# Patient Record
Sex: Male | Born: 1980 | Race: Black or African American | Hispanic: No | Marital: Married | Smoking: Current every day smoker
Health system: Southern US, Community
[De-identification: ages and names within clinical notes are randomized; demographics above are authoritative.]

## PROBLEM LIST (undated history)

## (undated) DIAGNOSIS — B029 Zoster without complications: Secondary | ICD-10-CM

## (undated) HISTORY — PX: TYMPANOSTOMY TUBE PLACEMENT: SHX32

## (undated) HISTORY — PX: NASAL SINUS SURGERY: SHX719

---

## 2015-02-21 ENCOUNTER — Emergency Department (HOSPITAL_COMMUNITY): Payer: Self-pay

## 2015-02-21 ENCOUNTER — Encounter (HOSPITAL_COMMUNITY): Payer: Self-pay | Admitting: Emergency Medicine

## 2015-02-21 ENCOUNTER — Emergency Department (HOSPITAL_COMMUNITY)
Admission: EM | Admit: 2015-02-21 | Discharge: 2015-02-21 | Disposition: A | Discharge status: Home / Self Care | Payer: Self-pay | Attending: Emergency Medicine | Admitting: Emergency Medicine

## 2015-02-21 DIAGNOSIS — Y9289 Other specified places as the place of occurrence of the external cause: Secondary | ICD-10-CM | POA: Insufficient documentation

## 2015-02-21 DIAGNOSIS — Y998 Other external cause status: Secondary | ICD-10-CM | POA: Insufficient documentation

## 2015-02-21 DIAGNOSIS — S82102A Unspecified fracture of upper end of left tibia, initial encounter for closed fracture: Secondary | ICD-10-CM | POA: Insufficient documentation

## 2015-02-21 DIAGNOSIS — Y9389 Activity, other specified: Secondary | ICD-10-CM | POA: Insufficient documentation

## 2015-02-21 DIAGNOSIS — Z720 Tobacco use: Secondary | ICD-10-CM | POA: Insufficient documentation

## 2015-02-21 DIAGNOSIS — S82142A Displaced bicondylar fracture of left tibia, initial encounter for closed fracture: Secondary | ICD-10-CM

## 2015-02-21 DIAGNOSIS — X58XXXA Exposure to other specified factors, initial encounter: Secondary | ICD-10-CM | POA: Insufficient documentation

## 2015-02-21 MED ORDER — HYDROCODONE-ACETAMINOPHEN 5-325 MG PO TABS: 2.0000 | ORAL_TABLET | ORAL | Status: DC | PRN

## 2015-02-21 NOTE — ED Notes (Signed)
Pt states that he was standing playing with his son when he had a sudden sharp pain in his lt knee - believes that he dislocated his knee cap at that time. Then on Monday it slipped back into place- Pain ever since

## 2015-02-21 NOTE — ED Provider Notes (Signed)
CSN: [id number]     Arrival date & time 02/21/15  1057 History   First MD Initiated Contact with Patient 02/21/15 1149     Chief Complaint  Patient presents with  . Knee Injury     (Consider location/radiation/quality/duration/timing/severity/associated sxs/prior Treatment) Patient is a 34 y.o. male presenting with knee pain. The history is provided by the patient. No language interpreter was used.  Knee Pain Location:  Knee Time since incident:  1 day Knee location:  L knee Pain details:    Quality:  Aching   Radiates to:  Does not radiate   Severity:  Moderate   Onset quality:  Gradual   Duration:  1 day   Timing:  Constant   Progression:  Worsening Chronicity:  New Foreign body present:  No foreign bodies Tetanus status:  Up to date Relieved by:  Nothing Worsened by:  Nothing tried Ineffective treatments:  None tried Associated symptoms: decreased ROM   Risk factors: no recent illness     History reviewed. No pertinent past medical history. Past Surgical History  Procedure Laterality Date  . Nasal sinus surgery    . Tympanostomy tube placement     History reviewed. No pertinent family history. History  Substance Use Topics  . Smoking status: Current Every Day Smoker -- 0.50 packs/day for 10 years    Types: Cigarettes  . Smokeless tobacco: Never Used  . Alcohol Use: Yes     Comment: occ    Review of Systems  All other systems reviewed and are negative.     Allergies  Review of patient's allergies indicates no known allergies.  Home Medications   Prior to Admission medications   Medication Sig Start Date End Date Taking? Authorizing Provider  acetaminophen (TYLENOL) 500 MG tablet Take 1,000 mg by mouth every 6 (six) hours as needed for mild pain.   Yes Historical Provider, MD   BP 147/79 mmHg  Pulse 67  Temp(Src) 98.9 F (37.2 C) (Oral)  Resp 18  Ht 6' (1.829 m)  Wt 145 lb (65.772 kg)  BMI 19.66 kg/m2  SpO2 99% Physical Exam  Constitutional:  He is oriented to person, place, and time. He appears well-developed and well-nourished.  HENT:  Head: Normocephalic.  Eyes: EOM are normal.  Neck: Normal range of motion.  Pulmonary/Chest: Effort normal.  Abdominal: He exhibits no distension.  Musculoskeletal: He exhibits tenderness.  Swollen tender left knee, effusion  Limited range of motion  nv and ns intact  Neurological: He is alert and oriented to person, place, and time.  Psychiatric: He has a normal mood and affect.  Nursing note and vitals reviewed.   ED Course  Procedures (including critical care time) Labs Review Labs Reviewed - No data to display  Imaging Review Dg Knee Complete 4 Views Left  02/21/2015   CLINICAL DATA:  Sudden sharp left knee pain while standing and playing with his son 3 days ago.  EXAM: LEFT KNEE - COMPLETE 4+ VIEW  COMPARISON:  None.  FINDINGS: There is a fracture in the medial tibial plateau anteriorly, involving the joint space. No significant displacement. Minimal effusion. Diffuse soft tissue swelling.  IMPRESSION: Medial tibial plateau fracture with an associated minimal effusion.   Electronically Signed   By: Beckie Salts M.D.   On: 02/21/2015 11:59     EKG Interpretation None      MDM   Final diagnoses:  Tibial plateau fracture, left, closed, initial encounter    I spoke to Dr. Romeo Apple who  advised he will see in office Knee imbolizer Crutches Hydrocdone avs    Elson AreasLeslie K Sofia, PA-C 02/22/15 [id number]  Bethann BerkshireJoseph Zammit, MD 02/23/15 [phone]

## 2015-02-21 NOTE — Discharge Instructions (Signed)
Tibial and Fibular Fracture, Adult °Tibial and fibular fracture is a break in the bones of your lower leg (tibia and fibula). The tibia is the larger of these two bones. The fibula is the smaller of the two bones. It is on the outer side of your leg.  °CAUSES °· Low-energy injuries, such as a fall from ground level. °· High-energy injuries, such as motor vehicle injuries, gunshot wounds, or high-speed sports collisions. °RISK FACTORS °· Jumping activities. °· Repetitive stress, such as long-distance running. °· Participation in sports. °· Osteoporosis. °· Advanced age. °SIGNS AND SYMPTOMS °· Pain. °· Swelling. °· Inability to put weight on your injured leg. °· Bone deformities at the site of your injury. °· Bruising. °DIAGNOSIS  °Tibial and fibular fractures are diagnosed with the use of X-ray exams. °TREATMENT  °If you have a simple fracture of these two bones, they can be treated with simple immobilization. A cast or splint will be used on your leg to keep it from moving while it heals. Then you can begin range-of-motion exercises to regain your knee motion. °HOME CARE INSTRUCTIONS  °· Apply ice to your leg: °¨ Put ice in a plastic bag. °¨ Place a towel between your skin and the bag. °¨ Leave the ice on for 20 minutes, 2-3 times a day. °· If you have a plaster or fiberglass cast: °¨ Do not try to scratch the skin under the cast using sharp or pointed objects. °¨ Check the skin around the cast every day. You may put lotion on any red or sore areas. °¨ Keep your cast dry and clean. °· If you have a plaster splint: °¨ Wear the splint as directed. °¨ You may loosen the elastic around the splint if your toes become numb, tingle, or turn cold or blue. °· Do not put pressure on any part of your cast or splint until it is fully hardened, because it may deform. °· Your cast or splint can be protected during bathing with a plastic bag. Do not lower the cast or splint into water. °· Use crutches as directed. °· Only take  over-the-counter or prescription medicines for pain, discomfort, or fever as directed by your health care provider. °· Follow all instructions given to you by your health care provider. °· Make and keep all follow-up appointments. °SEEK MEDICAL CARE IF: °· Your pain is becoming worse rather than better or is not controlled with medicines. °· You have increased swelling or redness in the foot. °· You begin to lose feeling in your foot or toes. °SEEK IMMEDIATE MEDICAL CARE IF: °· You develop a cold or blue foot or toes on the injured side. °· You develop severe pain in your injured leg, especially if the pain is increased with movement of your toes. °MAKE SURE YOU: °· Understand these instructions. °· Will watch your condition. °· Will get help right away if you are not doing well or get worse. °Document Released: 04/25/2002 Document Revised: 05/24/2013 Document Reviewed: 03/15/2013 °[employer]® Patient Information ©2015 [employer]. This information is not intended to replace advice given to you by your health care provider. Make sure you discuss any questions you have with your health care provider. ° °

## 2015-02-25 ENCOUNTER — Ambulatory Visit (INDEPENDENT_AMBULATORY_CARE_PROVIDER_SITE_OTHER): Payer: Self-pay | Admitting: Orthopedic Surgery

## 2015-02-25 ENCOUNTER — Encounter: Payer: Self-pay | Admitting: Orthopedic Surgery

## 2015-02-25 VITALS — BP 132/85 | Ht 72.0 in | Wt 145.0 lb

## 2015-02-25 DIAGNOSIS — S82112A Displaced fracture of left tibial spine, initial encounter for closed fracture: Secondary | ICD-10-CM

## 2015-02-25 MED ORDER — IBUPROFEN 800 MG PO TABS: 800.0000 mg | ORAL_TABLET | Freq: Three times a day (TID) | ORAL | Status: DC | PRN

## 2015-02-25 MED ORDER — HYDROCODONE-ACETAMINOPHEN 10-325 MG PO TABS: 1.0000 | ORAL_TABLET | ORAL | Status: DC | PRN
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
## 2015-02-25 NOTE — Patient Instructions (Signed)
Out of work 6 weeks  Keep leg straight  Call when you are ready to schedule MRI

## 2015-03-11 ENCOUNTER — Telehealth: Payer: Self-pay | Admitting: Orthopedic Surgery

## 2015-03-11 NOTE — Telephone Encounter (Signed)
Patient called to relay that he would like to proceed with MRI as discussed at appointment 02/25/15; states is prepared with self-pay amount.  Patient has appointment scheduled here 03/21/15.  Please advise regarding ordering the MRI.  Patient's ph# is [phone].

## 2015-03-12 ENCOUNTER — Other Ambulatory Visit: Payer: Self-pay | Admitting: *Deleted

## 2015-03-12 DIAGNOSIS — S82112A Displaced fracture of left tibial spine, initial encounter for closed fracture: Secondary | ICD-10-CM

## 2015-03-12 NOTE — Telephone Encounter (Signed)
NOTED, MRI ORDERED, PATIENT WILL BE CALLED ONCE MRI IS SCHEDULED

## 2015-03-21 ENCOUNTER — Ambulatory Visit: Payer: Self-pay | Admitting: Orthopedic Surgery

## 2015-03-27 ENCOUNTER — Ambulatory Visit (HOSPITAL_COMMUNITY)
Admission: RE | Admit: 2015-03-27 | Discharge: 2015-03-27 | Disposition: A | Discharge status: Home / Self Care | Payer: Self-pay | Source: Ambulatory Visit | Attending: Orthopedic Surgery | Admitting: Orthopedic Surgery

## 2015-03-27 DIAGNOSIS — Y939 Activity, unspecified: Secondary | ICD-10-CM | POA: Insufficient documentation

## 2015-03-27 DIAGNOSIS — S82142A Displaced bicondylar fracture of left tibia, initial encounter for closed fracture: Secondary | ICD-10-CM | POA: Insufficient documentation

## 2015-03-27 DIAGNOSIS — Y929 Unspecified place or not applicable: Secondary | ICD-10-CM | POA: Insufficient documentation

## 2015-03-27 DIAGNOSIS — S82112A Displaced fracture of left tibial spine, initial encounter for closed fracture: Secondary | ICD-10-CM

## 2015-03-28 ENCOUNTER — Ambulatory Visit (INDEPENDENT_AMBULATORY_CARE_PROVIDER_SITE_OTHER): Payer: Self-pay | Admitting: Orthopedic Surgery

## 2015-03-28 VITALS — BP 128/73 | Ht 72.0 in | Wt 145.0 lb

## 2015-03-28 DIAGNOSIS — S82112A Displaced fracture of left tibial spine, initial encounter for closed fracture: Secondary | ICD-10-CM

## 2015-03-28 MED ORDER — HYDROCODONE-ACETAMINOPHEN 7.5-325 MG PO TABS: 1.0000 | ORAL_TABLET | Freq: Four times a day (QID) | ORAL | Status: DC | PRN

## 2015-03-28 NOTE — Progress Notes (Signed)
Patient ID: Joel Proctor, male   DOB: [birth year], 34 y.o.   MRN: [id number] Patient ID: Joel Proctor, male   DOB: 1981/08/02, 34 y.o.   MRN: [id number]  Follow up visit  Chief Complaint  Patient presents with  . Follow-up    3 week follow up left knee (tib plat fx), DOI 02/17/15    BP 128/73 mmHg  Ht 6' (1.829 m)  Wt 145 lb (65.772 kg)  BMI 19.66 kg/m2  Encounter Diagnosis  Name Primary?  . Fracture of tibial spine, closed, left, initial encounter Yes    MRI follow-up MRI shows tibial plateau fracture involving the tibial spine and medial tibia nondisplaced with no depression this fracture extends into the tibial spine anterior cruciate ligament intact remainder of the joint looks good chondromalacia noted from the impaction fracture laterally some tibial articular cartilage fissuring medially but basically anterior cruciate ligament was intact  Patient placed in hinged knee brace weightbearing protected with a brace and crutches may time may wean to brace only follow-up 5 weeks continue Norco 7.5 mg with ibuprofen

## 2015-05-02 ENCOUNTER — Ambulatory Visit: Payer: Self-pay | Admitting: Orthopedic Surgery

## 2015-05-13 ENCOUNTER — Ambulatory Visit: Payer: Self-pay | Admitting: Orthopedic Surgery

## 2015-05-13 ENCOUNTER — Encounter: Payer: Self-pay | Admitting: Orthopedic Surgery

## 2015-06-13 ENCOUNTER — Ambulatory Visit: Payer: Self-pay | Admitting: Orthopedic Surgery

## 2015-11-21 ENCOUNTER — Ambulatory Visit: Payer: Self-pay | Admitting: Orthopedic Surgery

## 2015-11-22 ENCOUNTER — Ambulatory Visit (INDEPENDENT_AMBULATORY_CARE_PROVIDER_SITE_OTHER): Payer: BLUE CROSS/BLUE SHIELD | Admitting: Orthopedic Surgery

## 2015-11-22 ENCOUNTER — Ambulatory Visit (INDEPENDENT_AMBULATORY_CARE_PROVIDER_SITE_OTHER): Payer: BLUE CROSS/BLUE SHIELD

## 2015-11-22 VITALS — BP 135/79 | Ht 72.0 in | Wt 135.0 lb

## 2015-11-22 DIAGNOSIS — S82142D Displaced bicondylar fracture of left tibia, subsequent encounter for closed fracture with routine healing: Secondary | ICD-10-CM

## 2015-11-22 MED ORDER — NAPROXEN 500 MG PO TABS: 500.0000 mg | ORAL_TABLET | Freq: Two times a day (BID) | ORAL | Status: DC

## 2015-11-22 NOTE — Progress Notes (Signed)
Patient ID: Joel Proctor, male   DOB: 1981/03/12, 35 y.o.   MRN: [id number]  Chief Complaint  Patient presents with  . Follow-up    Recheck left knee, tibial plateau fracture, DOI 02-17-15.    HPI-I last saw this patient in August 2016 he had a MRI which showed he had a tibial plateau fracture and I haven't seen him since he's rescheduled his appointment several times mainly because he didn't have insurance he complains of whether related and temperature related pain in his left knee. He says he spine as long as the weather is good.  Review of Systems  Constitutional: Negative for fever and chills.  Musculoskeletal: Positive for joint pain. Negative for falls.  Neurological: Negative for tingling and weakness.   No past medical History reported BP 135/79 mmHg  Ht 6' (1.829 m)  Wt 135 lb (61.236 kg)  BMI 18.31 kg/m2  Physical Exam  Constitutional: He is oriented to person, place, and time. He appears well-developed and well-nourished. No distress.  Cardiovascular: Normal rate and intact distal pulses.   Neurological: He is alert and oriented to person, place, and time.  Skin: Skin is warm and dry. No rash noted. He is not diaphoretic. No erythema. No pallor.  Psychiatric: He has a normal mood and affect. His behavior is normal. Judgment and thought content normal.    Ortho Exam He is walking with a fairly normal gait Right knee full range of motion stable  Left knee stable flexion is 130 its 135 on the right. He has mild medial and lateral joint line tenderness. He has mild pain with McMurray's maneuver. Muscle tone and strength are normal. No joint effusion is noted.  ASSESSMENT AND PLAN   X-ray shows fracture healing alignment normal  Impression pain question arthritis question meniscal tear. Recommend anti-inflammatories and come back in 2 months if no improvement MRI can be done to assess the meniscus

## 2016-01-27 ENCOUNTER — Ambulatory Visit (INDEPENDENT_AMBULATORY_CARE_PROVIDER_SITE_OTHER): Payer: BLUE CROSS/BLUE SHIELD | Admitting: Orthopedic Surgery

## 2016-01-27 ENCOUNTER — Encounter: Payer: Self-pay | Admitting: Orthopedic Surgery

## 2016-01-27 DIAGNOSIS — S82142D Displaced bicondylar fracture of left tibia, subsequent encounter for closed fracture with routine healing: Secondary | ICD-10-CM

## 2016-01-27 NOTE — Progress Notes (Signed)
NO SHOW

## 2016-04-10 ENCOUNTER — Ambulatory Visit: Payer: BLUE CROSS/BLUE SHIELD | Admitting: Orthopedic Surgery

## 2016-04-21 ENCOUNTER — Ambulatory Visit: Payer: BLUE CROSS/BLUE SHIELD | Admitting: Orthopedic Surgery

## 2016-04-21 ENCOUNTER — Encounter: Payer: Self-pay | Admitting: Orthopedic Surgery

## 2016-06-13 IMAGING — DX DG KNEE COMPLETE 4+V*L*
4 series · 4 of 4 positions shown · non-contrast
Comparison: None.

CLINICAL DATA: Sudden sharp left knee pain while standing and
playing with his son 3 days ago.

EXAM:
LEFT KNEE - COMPLETE 4+ VIEW

[knee ap]
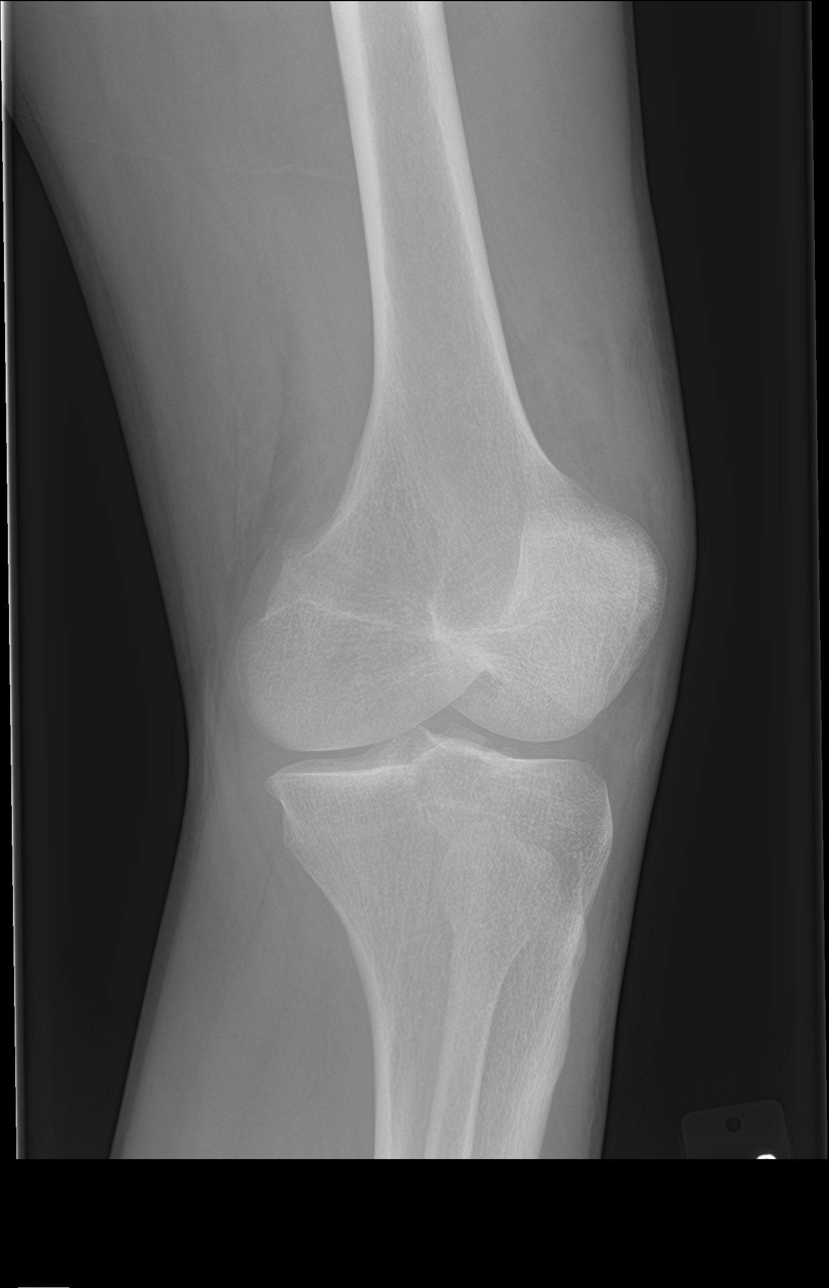

[knee obl (1 of 2)]
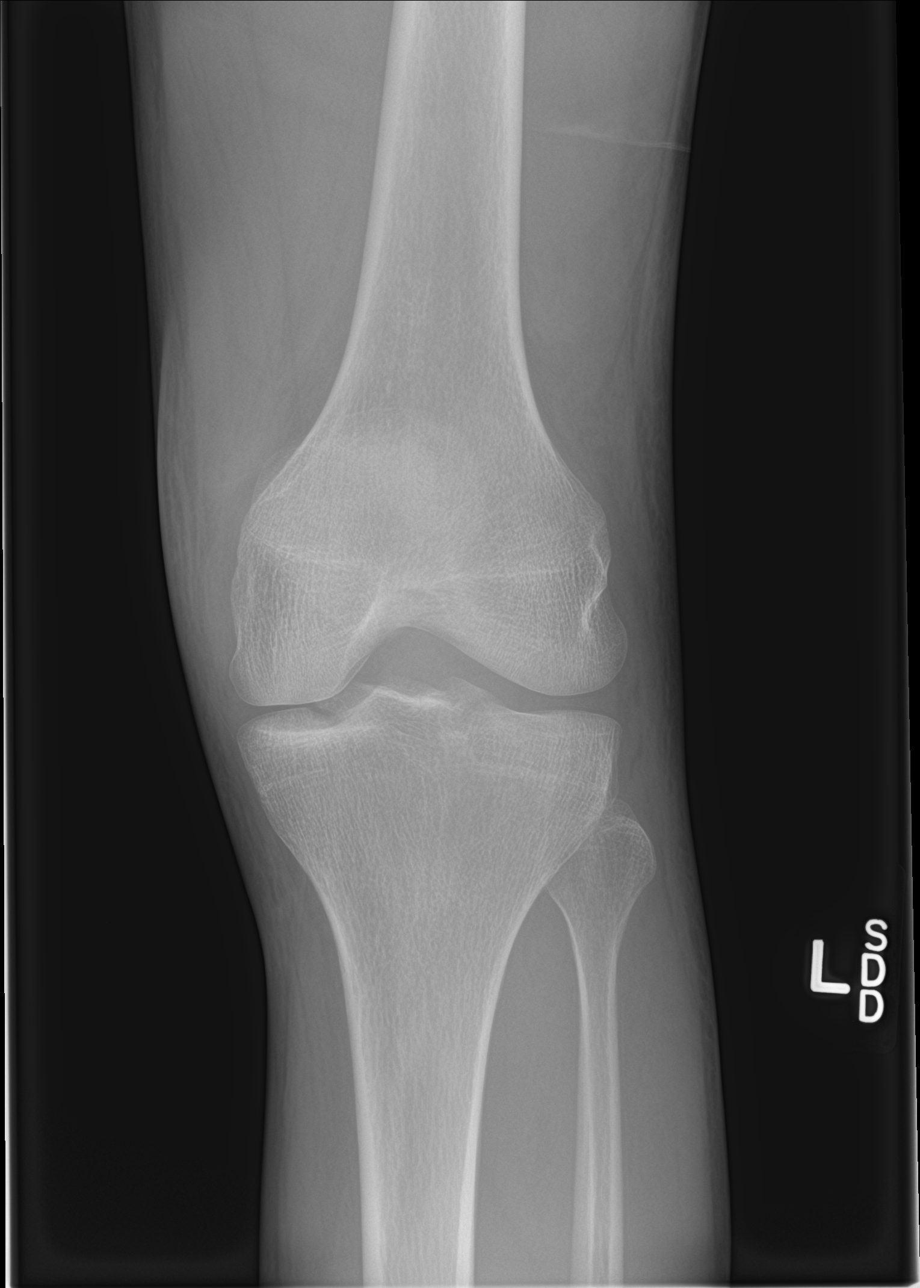

[knee obl (2 of 2)]
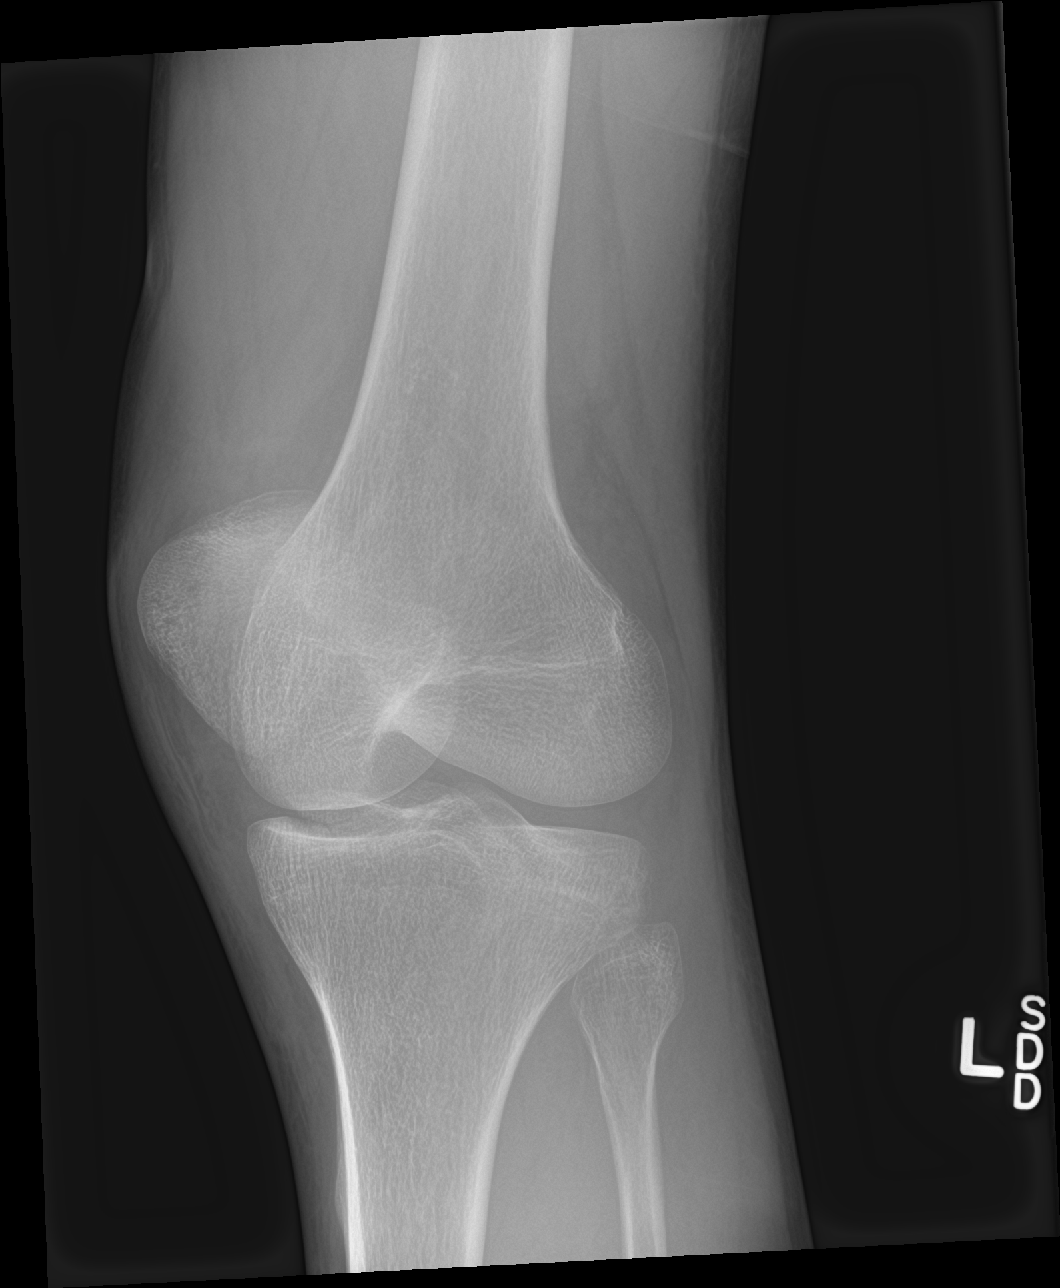

[knee lat]
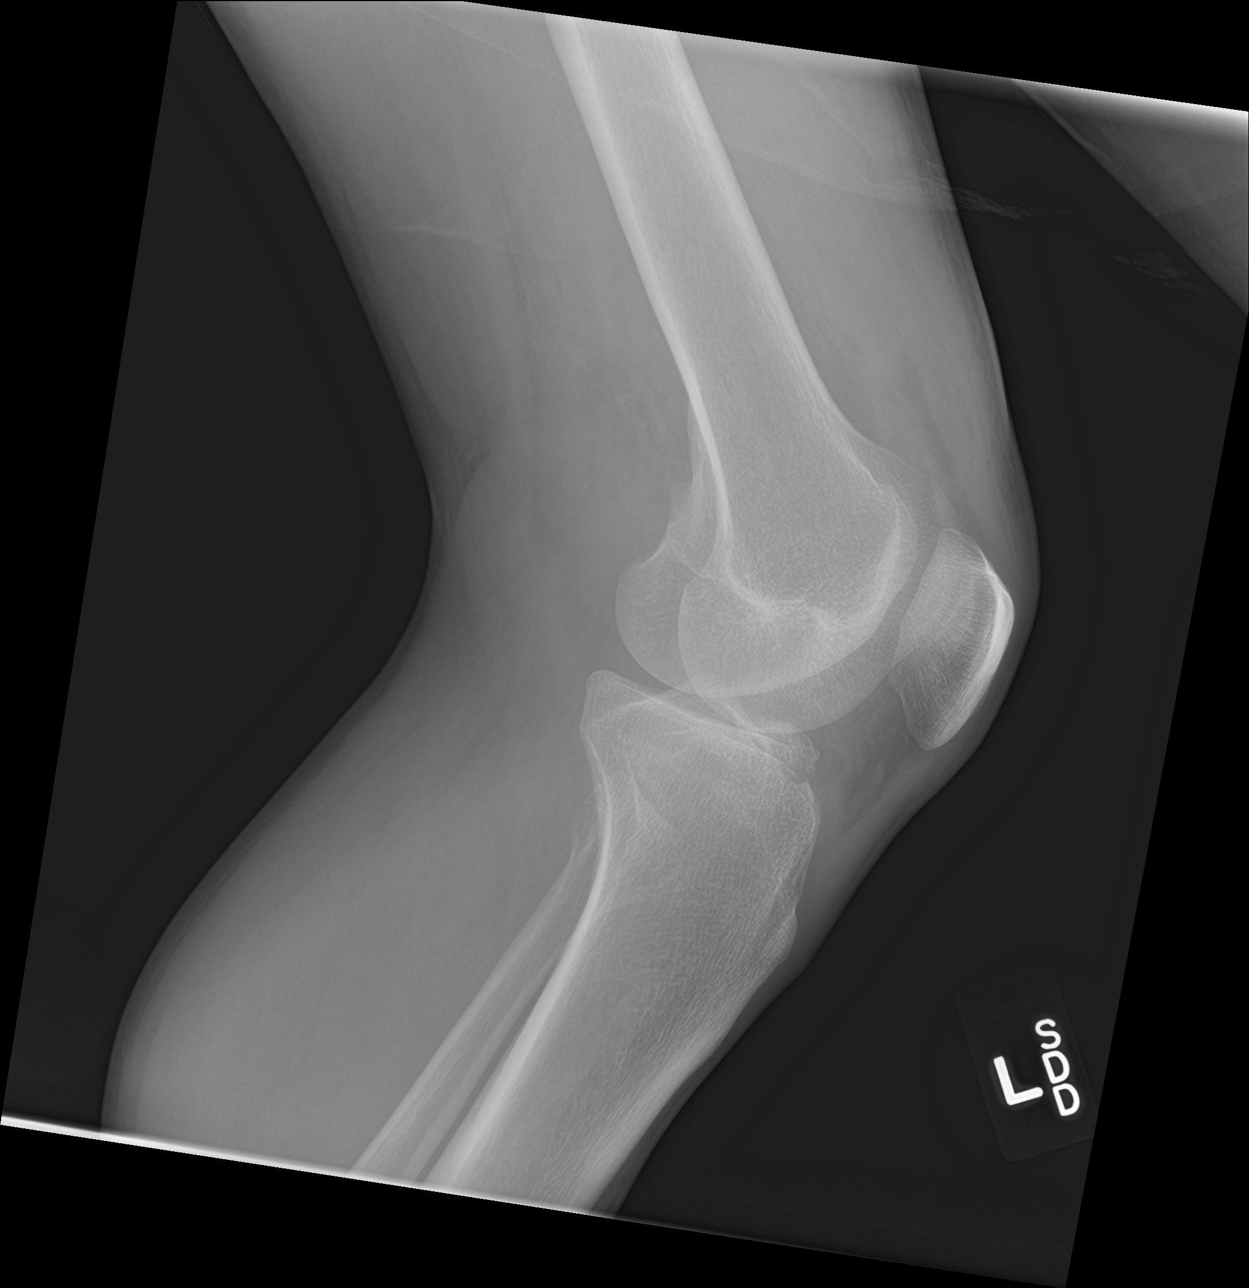

[4 of 4 positions shown; findings below may reference images not displayed]

FINDINGS: There is a fracture in the medial tibial plateau anteriorly,
involving the joint space. No significant displacement. Minimal
effusion. Diffuse soft tissue swelling.
IMPRESSION: Medial tibial plateau fracture with an associated minimal effusion.

## 2016-07-17 IMAGING — MR MR KNEE*L* W/O CM
4 of 6 series · 13 of 40 positions shown · non-contrast
Comparison: 02/21/2015.

CLINICAL DATA: Tibial spine fracture. Knee pain. Twisting injury
several weeks ago.

EXAM:
MRI OF THE LEFT KNEE WITHOUT CONTRAST
TECHNIQUE: Multiplanar, multisequence MR imaging of the knee was performed. No
intravenous contrast was administered.

[Series 5: pdfs axial · axial · 3.0mm · 0.22mm/px · z∈[-55,+39]mm · 3 of 36 slices shown]
[im 5/36]
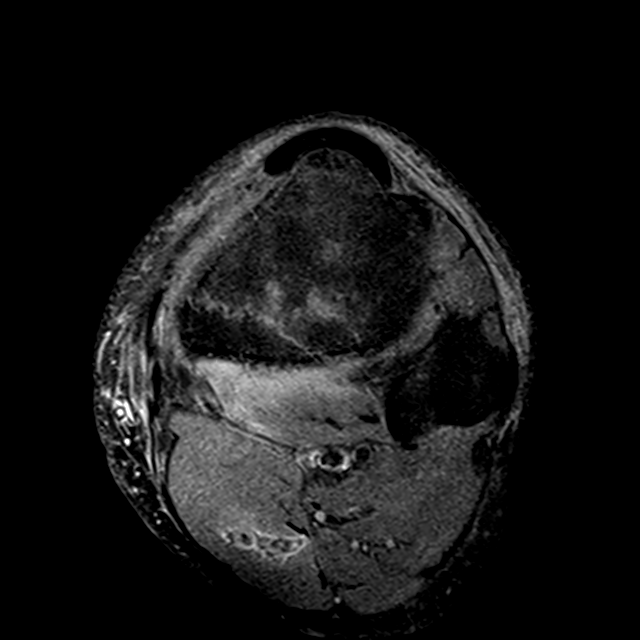
[im 18/36]
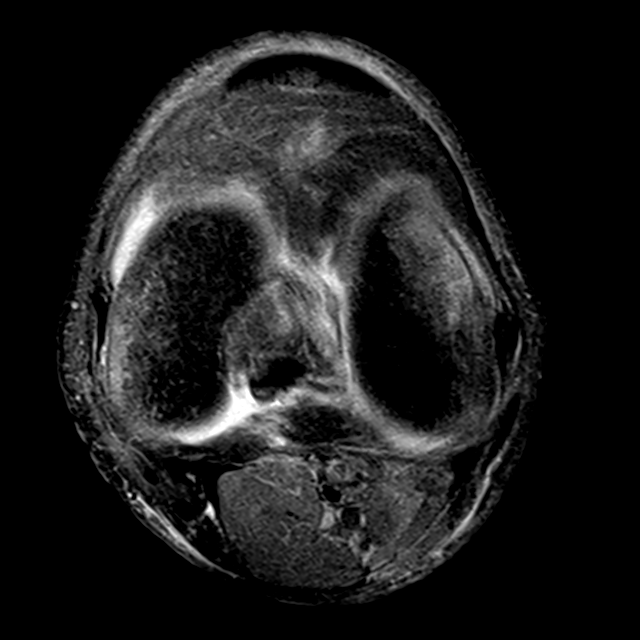
[im 31/36]
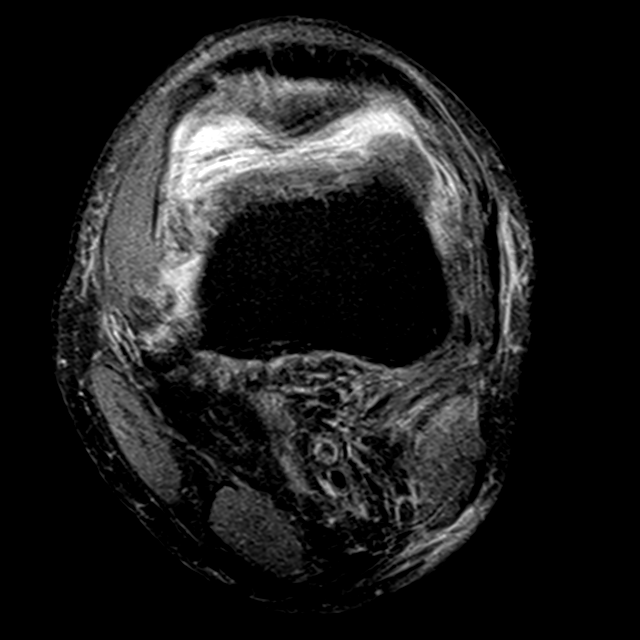

[Series 6: T1 · coronal · 3.0mm · 0.22mm/px · 4 of 30 slices shown]
[im 1/30]
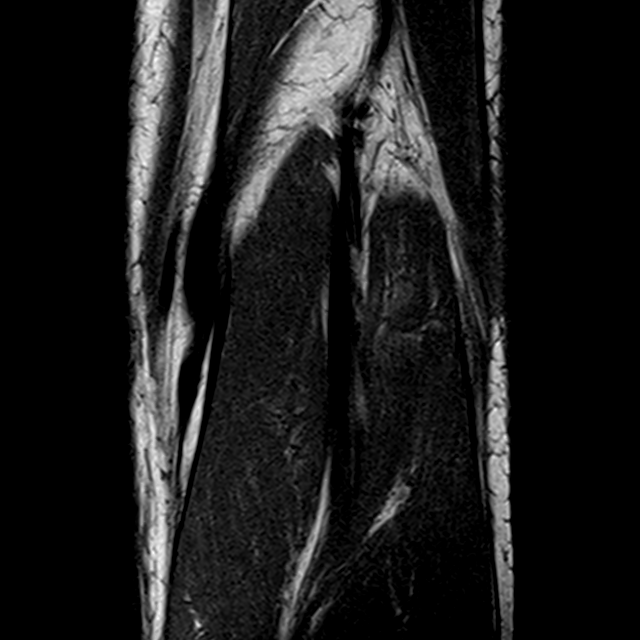
[im 5/30]
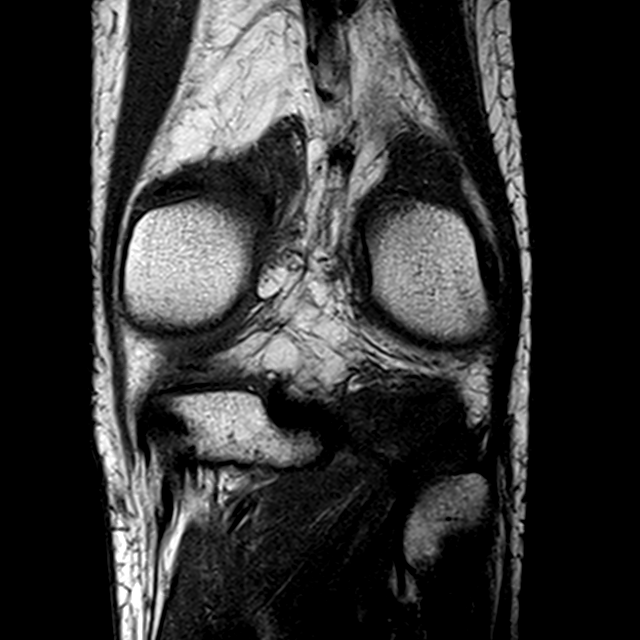
[im 15/30]
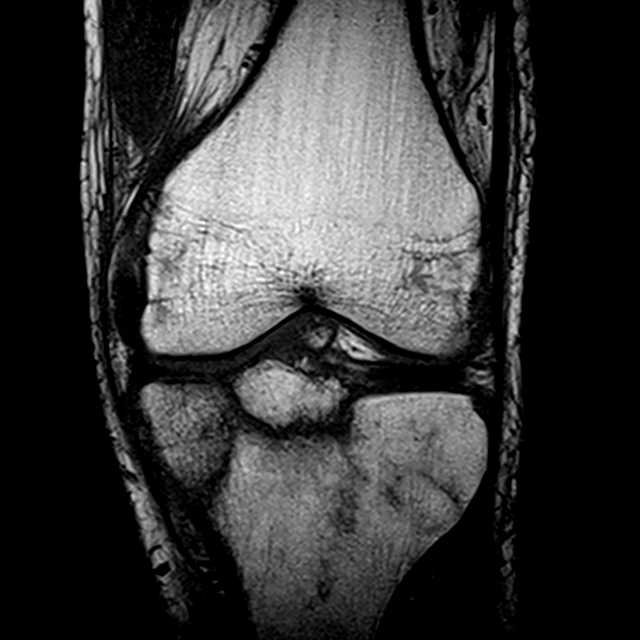
[im 25/30]
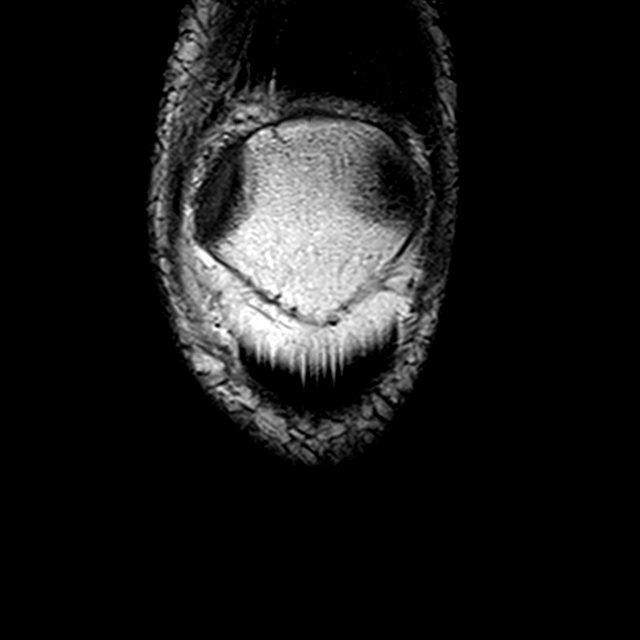

[Series 7: pdfs sag · sagittal · 3.0mm · 0.21mm/px · 3 of 31 slices shown]
[im 6/31]
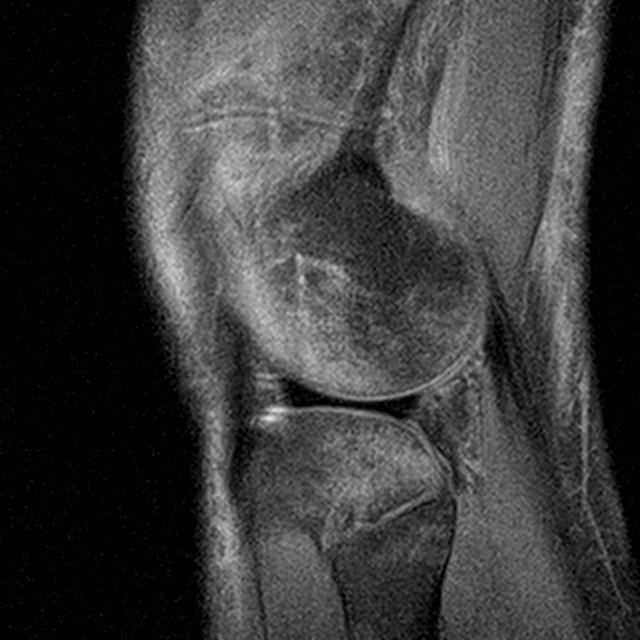
[im 16/31]
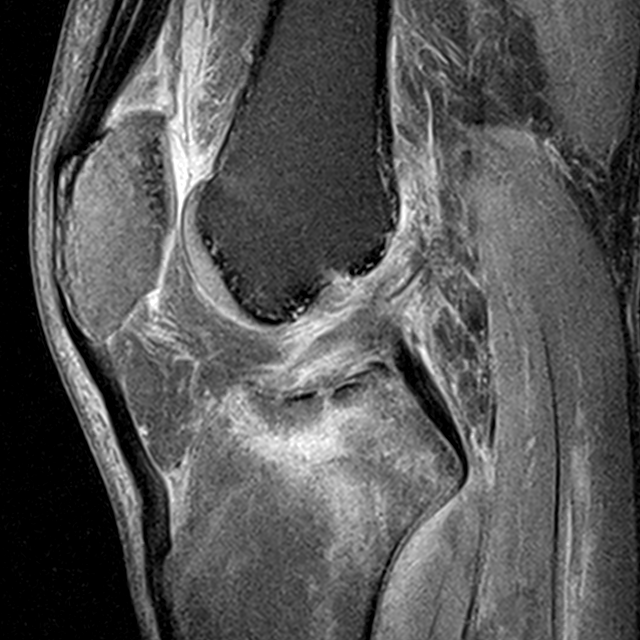
[im 26/31]
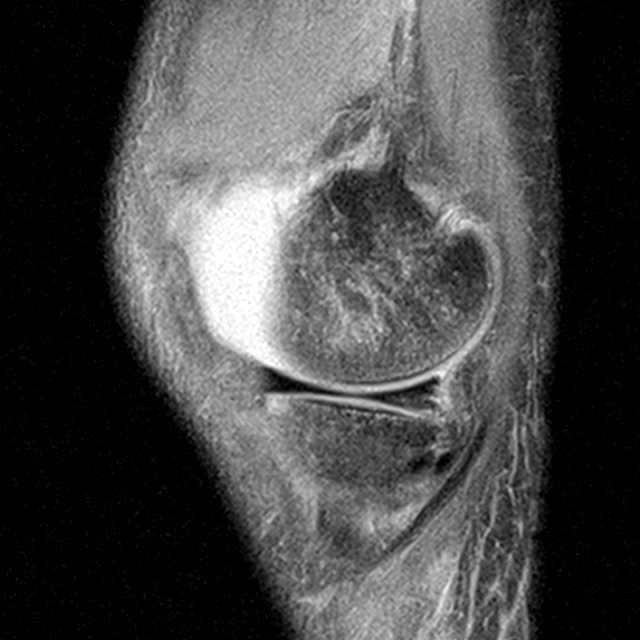

[Series 8: t2fs cor · coronal · 3.0mm · 0.20mm/px · 3 of 30 slices shown]
[im 5/30]
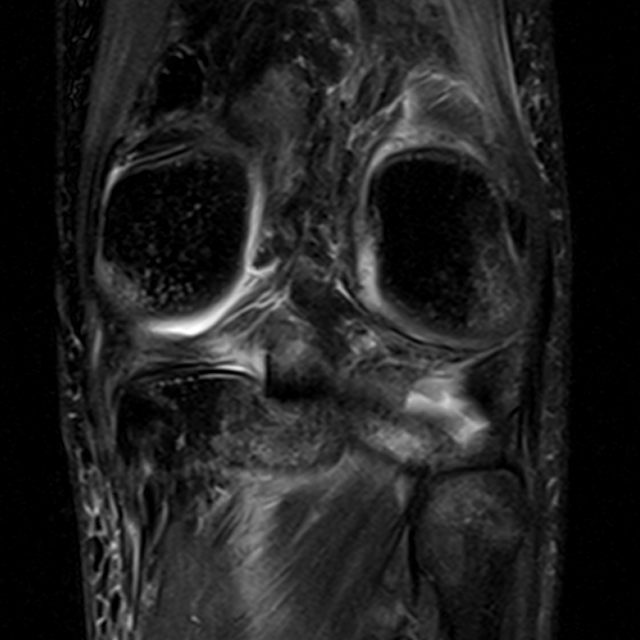
[im 15/30]
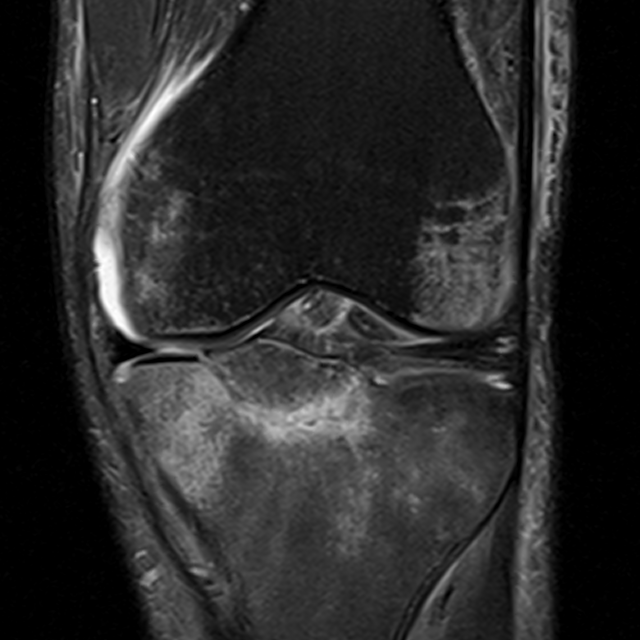
[im 25/30]
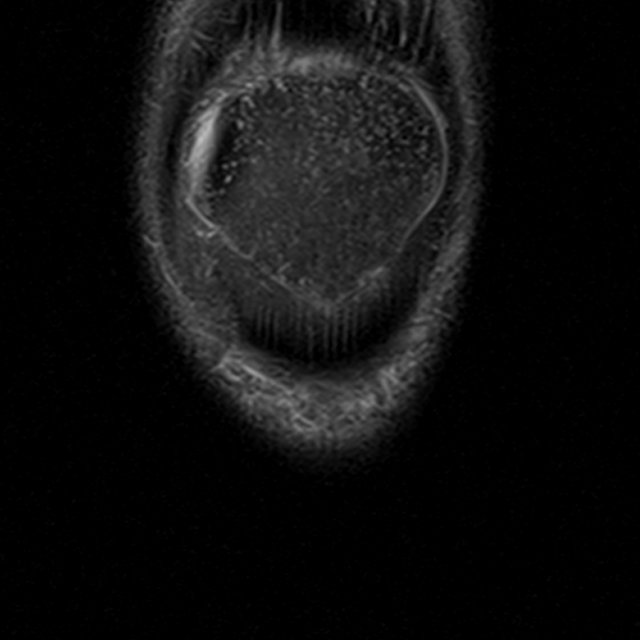

[13 of 40 positions shown; findings below may reference images not displayed]

FINDINGS: MENISCI

Medial meniscus:  Intact.

Lateral meniscus:  Intact.

LIGAMENTS

Cruciates: Distal ACL is edematous but is intact. The insertion on
the tibial eminence is intact. Proximal ACL appears normal. PCL
intact.

Collaterals:  Intact.

CARTILAGE

Patellofemoral: Mild patellofemoral osteoarthritis. Grade II
chondromalacia at the apex. Trochlear cartilage preserved.

Medial: Intact femoral articular cartilage. Tibial plateau articular
cartilage shows fissuring associated with the anterior tibial
plateau fracture component. No significant step-off deformity.

Lateral: Chondromalacia in the lateral femoral notch associated with
impaction. Chondral fissuring is present in the lateral tibial
plateau associated with tibial plateau fracture. No step-off
deformity.

Joint: Effusion with debris and hemorrhage in the suprapatellar
pouch.

Popliteal Fossa:  Tiny uncomplicated Baker's cyst.

Extensor Mechanism:  Intact.

Bones: Nondepressed tibial plateau fracture identified on plain
films again noted. This extends through the anterior tibial eminence
and medial and lateral tibial plateau. There is contour abnormality
in the anterior medial tibial plateau the produces posterior
downsloping seen on sagittal imaging. No depressed fragments.
Posterior lateral tibial plateau impaction fracture is present.
Contusions are present in the femur.
IMPRESSION: 1. Nondepressed tibial plateau fracture extending through the tibial
eminence. Displacement of the tibial eminence is minimal.
2. The ACL appears intact and continues to insert on the tibial
eminence with edema in the distal ligament.
3. Femoral and tibial bone contusions are compatible with a
pivot-shift injury. Although of the pattern is atypical, this is
likely associated with the tibial eminence fracture.
4. Negative for meniscal tear.

## 2017-03-31 ENCOUNTER — Emergency Department (HOSPITAL_COMMUNITY)
Admission: EM | Admit: 2017-03-31 | Discharge: 2017-03-31 | Disposition: A | Discharge status: Home / Self Care | Payer: BLUE CROSS/BLUE SHIELD | Attending: Emergency Medicine | Admitting: Emergency Medicine

## 2017-03-31 ENCOUNTER — Encounter (HOSPITAL_COMMUNITY): Payer: Self-pay | Admitting: Emergency Medicine

## 2017-03-31 DIAGNOSIS — F17210 Nicotine dependence, cigarettes, uncomplicated: Secondary | ICD-10-CM | POA: Insufficient documentation

## 2017-03-31 DIAGNOSIS — Z79899 Other long term (current) drug therapy: Secondary | ICD-10-CM | POA: Insufficient documentation

## 2017-03-31 DIAGNOSIS — B029 Zoster without complications: Secondary | ICD-10-CM

## 2017-03-31 HISTORY — DX: Zoster without complications: B02.9

## 2017-03-31 MED ORDER — MORPHINE SULFATE (PF) 4 MG/ML IV SOLN
4.0000 mg | Freq: Once | INTRAVENOUS | Status: AC
  Administered 2017-03-31: 4 mg via INTRAMUSCULAR
  Filled 2017-03-31: qty 1

## 2017-03-31 MED ORDER — ONDANSETRON HCL 4 MG PO TABS
4.0000 mg | ORAL_TABLET | Freq: Once | ORAL | Status: AC
  Administered 2017-03-31: 4 mg via ORAL
  Filled 2017-03-31: qty 1

## 2017-03-31 MED ORDER — ACYCLOVIR 5 % EX OINT: 1.0000 "application " | TOPICAL_OINTMENT | CUTANEOUS | 0 refills | Status: DC

## 2017-03-31 MED ORDER — ACYCLOVIR 800 MG PO TABS: 800.0000 mg | ORAL_TABLET | Freq: Every day | ORAL | 0 refills | Status: DC

## 2017-03-31 MED ORDER — HYDROCODONE-ACETAMINOPHEN 5-325 MG PO TABS: 1.0000 | ORAL_TABLET | ORAL | 0 refills | Status: DC | PRN

## 2017-03-31 NOTE — ED Triage Notes (Signed)
Per EMS: Pt has h/a and pustules on left side of face and L eye swelling.  Pt was seen for this Monday at PCP and prescribed acyclovir.   Pt feels like it is getting worse.

## 2017-03-31 NOTE — ED Provider Notes (Signed)
[facility]-EMER[facility]ENCY DEPT Provider Note   CSN: [id number] Arrival date & time: 03/31/17  1143     History   Chief Complaint Chief Complaint  Patient presents with  . Herpes Zoster    HPI Joel Proctor is a 36 y.o. male.  Pt presents to the ED today with herpes zoster that is worsening.  The pt said that he was diagnosed on Monday the 13th.  He was put on Acyclovir, but it is only 800 mg QID, not 5 times a day.  The pt said he feels like it is getting worse.  No fevers.  No blurry vision.      Past Medical History:  Diagnosis Date  . Shingles     There are no active problems to display for this patient.   Past Surgical History:  Procedure Laterality Date  . NASAL SINUS SURGERY    . TYMPANOSTOMY TUBE PLACEMENT         Home Medications    Prior to Admission medications   Medication Sig Start Date End Date Taking? Authorizing Provider  acyclovir (ZOVIRAX) 800 MG tablet Take 1 tablet (800 mg total) by mouth 5 (five) times daily. 03/31/17   Jacalyn Lefevre, MD  acyclovir ointment (ZOVIRAX) 5 % Apply 1 application topically every 3 (three) hours. 03/31/17   Jacalyn Lefevre, MD  HYDROcodone-acetaminophen (NORCO/VICODIN) 5-325 MG tablet Take 1 tablet by mouth every 4 (four) hours as needed. 03/31/17   Jacalyn Lefevre, MD  ibuprofen (ADVIL,MOTRIN) 800 MG tablet Take 1 tablet (800 mg total) by mouth every 8 (eight) hours as needed. 02/25/15   Vickki Hearing, MD  naproxen (NAPROSYN) 500 MG tablet Take 1 tablet (500 mg total) by mouth 2 (two) times daily with a meal. 11/22/15   Vickki Hearing, MD    Family History No family history on file.  Social History Social History  Substance Use Topics  . Smoking status: Current Every Day Smoker    Packs/day: 0.50    Years: 10.00    Types: Cigarettes  . Smokeless tobacco: Never Used  . Alcohol use Yes     Comment: occ     Allergies   Patient has no known allergies.   Review of Systems Review of Systems  Skin:  Positive for rash.  All other systems reviewed and are negative.    Physical Exam Updated Vital Signs BP 137/79 (BP Location: Right Arm)   Pulse 71   Temp 98.1 F (36.7 C) (Oral)   Resp 18   Ht 6\' 1"  (1.854 m)   Wt 63.5 kg (140 lb)   SpO2 99%   BMI 18.47 kg/m   Physical Exam  Constitutional: He appears well-developed and well-nourished.  HENT:  Head: Normocephalic and atraumatic.  Right Ear: External ear normal.  Left Ear: External ear normal.  Nose: Nose normal.  Mouth/Throat: Oropharynx is clear and moist.  Eyes: Pupils are equal, round, and reactive to light. Conjunctivae and EOM are normal.  Neck: Normal range of motion. Neck supple.  Cardiovascular: Normal rate, regular rhythm, normal heart sounds and intact distal pulses.   Pulmonary/Chest: Effort normal and breath sounds normal.  Abdominal: Soft. Bowel sounds are normal.  Musculoskeletal: Normal range of motion.  Neurological: He is alert.  Skin:  Herpes zoster in the left trigeminal nerve v1 distribution.  No evidence of ocular involvement.  It has spread to the eye lid only.  No lesions on the tip of the nose.  Nursing note and vitals reviewed.  ED Treatments / Results  Labs (all labs ordered are listed, but only abnormal results are displayed) Labs Reviewed - No data to display  EKG  EKG Interpretation None       Radiology No results found.  Procedures Procedures (including critical care time)  Medications Ordered in ED Medications  morphine 4 MG/ML injection 4 mg (not administered)  ondansetron (ZOFRAN) tablet 4 mg (not administered)     Initial Impression / Assessment and Plan / ED Course  I have reviewed the triage vital signs and the nursing notes.  Pertinent labs & imaging results that were available during my care of the patient were reviewed by me and considered in my medical decision making (see chart for details).    Correct dose of acyclovir rx'd.  Pt given lortab for the  pain.  He requested cream, so I gave him a rx for acyclovir ointment.  He knows to return if worse or if he notices any eye involvement.  Final Clinical Impressions(s) / ED Diagnoses   Final diagnoses:  Herpes zoster without complication    New Prescriptions New Prescriptions   ACYCLOVIR (ZOVIRAX) 800 MG TABLET    Take 1 tablet (800 mg total) by mouth 5 (five) times daily.   ACYCLOVIR OINTMENT (ZOVIRAX) 5 %    Apply 1 application topically every 3 (three) hours.   HYDROCODONE-ACETAMINOPHEN (NORCO/VICODIN) 5-325 MG TABLET    Take 1 tablet by mouth every 4 (four) hours as needed.     Jacalyn LefevreHaviland, Mikias Lanz, MD 03/31/17 1248

## 2017-03-31 NOTE — Discharge Instructions (Signed)
You can continue to take your current medication, but it needs to be 5 times a day, not 4.

## 2019-11-28 ENCOUNTER — Encounter (HOSPITAL_COMMUNITY): Payer: Self-pay

## 2019-11-28 ENCOUNTER — Emergency Department (HOSPITAL_COMMUNITY)
Admission: EM | Admit: 2019-11-28 | Discharge: 2019-11-28 | Disposition: A | Discharge status: Home / Self Care | Payer: No Typology Code available for payment source | Attending: Emergency Medicine | Admitting: Emergency Medicine

## 2019-11-28 ENCOUNTER — Other Ambulatory Visit: Payer: Self-pay

## 2019-11-28 ENCOUNTER — Emergency Department (HOSPITAL_COMMUNITY): Payer: No Typology Code available for payment source

## 2019-11-28 DIAGNOSIS — F17210 Nicotine dependence, cigarettes, uncomplicated: Secondary | ICD-10-CM | POA: Diagnosis not present

## 2019-11-28 DIAGNOSIS — Y939 Activity, unspecified: Secondary | ICD-10-CM | POA: Insufficient documentation

## 2019-11-28 DIAGNOSIS — Y92410 Unspecified street and highway as the place of occurrence of the external cause: Secondary | ICD-10-CM | POA: Insufficient documentation

## 2019-11-28 DIAGNOSIS — Y999 Unspecified external cause status: Secondary | ICD-10-CM | POA: Diagnosis not present

## 2019-11-28 DIAGNOSIS — R079 Chest pain, unspecified: Secondary | ICD-10-CM | POA: Diagnosis not present

## 2019-11-28 DIAGNOSIS — S060X0A Concussion without loss of consciousness, initial encounter: Secondary | ICD-10-CM | POA: Insufficient documentation

## 2019-11-28 DIAGNOSIS — S199XXA Unspecified injury of neck, initial encounter: Secondary | ICD-10-CM | POA: Diagnosis present

## 2019-11-28 DIAGNOSIS — S161XXA Strain of muscle, fascia and tendon at neck level, initial encounter: Secondary | ICD-10-CM | POA: Diagnosis not present

## 2019-11-28 DIAGNOSIS — M62830 Muscle spasm of back: Secondary | ICD-10-CM | POA: Diagnosis not present

## 2019-11-28 MED ORDER — HYDROCODONE-ACETAMINOPHEN 5-325 MG PO TABS: 1.0000 | ORAL_TABLET | ORAL | 0 refills | Status: AC | PRN

## 2019-11-28 MED ORDER — DIAZEPAM 5 MG PO TABS
5.0000 mg | ORAL_TABLET | Freq: Once | ORAL | Status: AC
  Administered 2019-11-28: 5 mg via ORAL
  Filled 2019-11-28: qty 1

## 2019-11-28 MED ORDER — IBUPROFEN 400 MG PO TABS
600.0000 mg | ORAL_TABLET | Freq: Once | ORAL | Status: AC
  Administered 2019-11-28: 600 mg via ORAL
  Filled 2019-11-28: qty 2

## 2019-11-28 MED ORDER — DIAZEPAM 5 MG PO TABS: 5.0000 mg | ORAL_TABLET | Freq: Two times a day (BID) | ORAL | 0 refills | Status: AC

## 2019-11-28 MED ORDER — HYDROCODONE-ACETAMINOPHEN 5-325 MG PO TABS
1.0000 | ORAL_TABLET | Freq: Once | ORAL | Status: AC
  Administered 2019-11-28: 1 via ORAL
  Filled 2019-11-28: qty 1

## 2019-11-28 MED ORDER — IBUPROFEN 600 MG PO TABS: 600.0000 mg | ORAL_TABLET | Freq: Four times a day (QID) | ORAL | 0 refills | Status: DC | PRN
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
## 2019-12-04 ENCOUNTER — Other Ambulatory Visit: Payer: Self-pay

## 2019-12-04 ENCOUNTER — Emergency Department (HOSPITAL_COMMUNITY)
Admission: EM | Admit: 2019-12-04 | Discharge: 2019-12-04 | Disposition: A | Discharge status: Home / Self Care | Payer: Self-pay | Attending: Emergency Medicine | Admitting: Emergency Medicine

## 2019-12-04 ENCOUNTER — Encounter (HOSPITAL_COMMUNITY): Payer: Self-pay | Admitting: *Deleted

## 2019-12-04 DIAGNOSIS — F17210 Nicotine dependence, cigarettes, uncomplicated: Secondary | ICD-10-CM | POA: Insufficient documentation

## 2019-12-04 DIAGNOSIS — R0789 Other chest pain: Secondary | ICD-10-CM | POA: Insufficient documentation

## 2019-12-04 DIAGNOSIS — Z79899 Other long term (current) drug therapy: Secondary | ICD-10-CM | POA: Insufficient documentation

## 2019-12-04 MED ORDER — METHOCARBAMOL 500 MG PO TABS: 500.0000 mg | ORAL_TABLET | Freq: Three times a day (TID) | ORAL | 0 refills | Status: AC | PRN
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
## 2019-12-14 ENCOUNTER — Other Ambulatory Visit (HOSPITAL_COMMUNITY): Payer: Self-pay | Admitting: Neurology

## 2019-12-14 ENCOUNTER — Other Ambulatory Visit: Payer: Self-pay | Admitting: Neurology

## 2019-12-14 DIAGNOSIS — I639 Cerebral infarction, unspecified: Secondary | ICD-10-CM

## 2019-12-25 ENCOUNTER — Encounter (HOSPITAL_COMMUNITY): Payer: Self-pay

## 2019-12-25 ENCOUNTER — Ambulatory Visit (HOSPITAL_COMMUNITY): Admission: RE | Admit: 2019-12-25 | Payer: Self-pay | Source: Ambulatory Visit

## 2024-02-01 ENCOUNTER — Emergency Department (HOSPITAL_COMMUNITY)
Admission: EM | Admit: 2024-02-01 | Discharge: 2024-02-01 | Disposition: A | Discharge status: Home / Self Care | Payer: Self-pay | Source: Home / Self Care | Attending: Student | Admitting: Student

## 2024-02-01 ENCOUNTER — Encounter (HOSPITAL_COMMUNITY): Payer: Self-pay

## 2024-02-01 ENCOUNTER — Emergency Department (HOSPITAL_COMMUNITY): Payer: Self-pay

## 2024-02-01 ENCOUNTER — Other Ambulatory Visit: Payer: Self-pay

## 2024-02-01 DIAGNOSIS — L810 Postinflammatory hyperpigmentation: Secondary | ICD-10-CM | POA: Insufficient documentation

## 2024-02-01 DIAGNOSIS — R229 Localized swelling, mass and lump, unspecified: Secondary | ICD-10-CM

## 2024-02-01 DIAGNOSIS — R2241 Localized swelling, mass and lump, right lower limb: Secondary | ICD-10-CM | POA: Insufficient documentation

## 2024-02-01 MED ORDER — IBUPROFEN 600 MG PO TABS: 600.0000 mg | ORAL_TABLET | Freq: Four times a day (QID) | ORAL | 0 refills | Status: AC | PRN

## 2024-02-01 NOTE — ED Provider Notes (Signed)
  [place] EMERGENCY DEPARTMENT AT [facility] Provider Note   CSN: [id number] Arrival date & time: 02/01/24  1028     Patient presents with: Abscess   Joel Proctor is a 43 y.o. male with no significant past hx presenting with a tender knot on his right lower leg for about 1 month.  Denies any injury but was worried about a blood clot after googling this condition.  Pain is worsened with palpation and direct pressure.  No drainage and no radiation of pain.   {Add pertinent medical, surgical, social history, OB history to YQI:34742} The history is provided by the patient.       Prior to Admission medications   Medication Sig Start Date End Date Taking? Authorizing Provider  diazepam  (VALIUM ) 5 MG tablet Take 1 tablet (5 mg total) by mouth 2 (two) times daily. 11/28/19   Sueellen Emery, MD  HYDROcodone -acetaminophen  (NORCO/VICODIN) 5-325 MG tablet Take 1 tablet by mouth every 4 (four) hours as needed. 11/28/19   Haviland, Lenni Reckner, MD  ibuprofen  (ADVIL ) 600 MG tablet Take 1 tablet (600 mg total) by mouth every 6 (six) hours as needed. 11/28/19   Sueellen Emery, MD  methocarbamol  (ROBAXIN ) 500 MG tablet Take 1 tablet (500 mg total) by mouth every 8 (eight) hours as needed for muscle spasms. 12/04/19   Bart Born, MD    Allergies: Codeine    Review of Systems  Updated Vital Signs BP (!) 138/98 (BP Location: Right Arm)   Pulse 77   Temp 97.7 F (36.5 C)   Resp 19   Ht 6' 1 (1.854 m)   Wt 63.5 kg   SpO2 100%   BMI 18.47 kg/m   Physical Exam  (all labs ordered are listed, but only abnormal results are displayed) Labs Reviewed - No data to display  EKG: None  Radiology: No results found.  {Document cardiac monitor, telemetry assessment procedure when appropriate:32947} Procedures   Medications Ordered in the ED - No data to display    {Click here for ABCD2, HEART and other calculators REFRESH Note before signing:1}                               Medical Decision Making Amount and/or Complexity of Data Reviewed Radiology: ordered.   ***  {Document critical care time when appropriate  Document review of labs and clinical decision tools ie CHADS2VASC2, etc  Document your independent review of radiology images and any outside records  Document your discussion with family members, caretakers and with consultants  Document social determinants of health affecting pt's care  Document your decision making why or why not admission, treatments were needed:32947:::1}   Final diagnoses:  None    ED Discharge Orders     None

## 2024-02-01 NOTE — ED Triage Notes (Signed)
 Pt arrived via POV c/o abscess to anterior right leg that has been present for apprx 1 month. Pt reports he works on his feet a lot, and the abscess is not getting better. Pt denies injury. Pt reports pain wakes him from sleep. Pt concerned of possible blood clot.

## 2024-02-01 NOTE — Discharge Instructions (Signed)
 Your xray is negative for any bone involvement and your exam does not suggest a blood clot in a blood vessel. This may be a lipoma which is a benign cyst in the skin.  I suggest followup with your primary MD or a dermatologist for this concern.  I am giving you several referrals for this.

## 2024-02-01 NOTE — ED Notes (Signed)
 Pt needing to leave right now, EDP made aware and discharged pt.
